# Patient Record
Sex: Female | Born: 1955 | Race: White | Hispanic: No | Marital: Married | Smoking: Former smoker
Health system: Southern US, Community
[De-identification: ages and names within clinical notes are randomized; demographics above are authoritative.]

## PROBLEM LIST (undated history)

## (undated) DIAGNOSIS — I00 Rheumatic fever without heart involvement: Secondary | ICD-10-CM

## (undated) DIAGNOSIS — Z972 Presence of dental prosthetic device (complete) (partial): Secondary | ICD-10-CM

## (undated) DIAGNOSIS — Z87442 Personal history of urinary calculi: Secondary | ICD-10-CM

## (undated) DIAGNOSIS — J189 Pneumonia, unspecified organism: Secondary | ICD-10-CM

## (undated) DIAGNOSIS — G510 Bell's palsy: Secondary | ICD-10-CM

## (undated) HISTORY — PX: APPENDECTOMY: SHX54

## (undated) HISTORY — PX: COLONOSCOPY: SHX174

## (undated) HISTORY — PX: KIDNEY STONE SURGERY: SHX686

---

## 1978-10-25 HISTORY — PX: APPENDECTOMY: SHX54

## 2002-10-25 HISTORY — PX: KIDNEY STONE SURGERY: SHX686

## 2006-10-25 DIAGNOSIS — I82409 Acute embolism and thrombosis of unspecified deep veins of unspecified lower extremity: Secondary | ICD-10-CM

## 2006-10-25 HISTORY — DX: Acute embolism and thrombosis of unspecified deep veins of unspecified lower extremity: I82.409

## 2020-05-30 ENCOUNTER — Other Ambulatory Visit: Payer: Self-pay | Admitting: Internal Medicine

## 2020-05-30 DIAGNOSIS — Z1231 Encounter for screening mammogram for malignant neoplasm of breast: Secondary | ICD-10-CM

## 2020-06-17 ENCOUNTER — Encounter (INDEPENDENT_AMBULATORY_CARE_PROVIDER_SITE_OTHER): Payer: Self-pay

## 2020-06-17 ENCOUNTER — Other Ambulatory Visit: Payer: Self-pay

## 2020-06-17 ENCOUNTER — Ambulatory Visit
Admission: RE | Admit: 2020-06-17 | Discharge: 2020-06-17 | Disposition: A | Discharge status: Home / Self Care | Payer: BC Managed Care – PPO | Source: Ambulatory Visit | Attending: Internal Medicine | Admitting: Internal Medicine

## 2020-06-17 DIAGNOSIS — Z1231 Encounter for screening mammogram for malignant neoplasm of breast: Secondary | ICD-10-CM | POA: Insufficient documentation

## 2020-07-07 ENCOUNTER — Other Ambulatory Visit: Payer: Self-pay | Admitting: Internal Medicine

## 2020-07-07 DIAGNOSIS — R928 Other abnormal and inconclusive findings on diagnostic imaging of breast: Secondary | ICD-10-CM

## 2020-07-14 ENCOUNTER — Ambulatory Visit
Admission: RE | Admit: 2020-07-14 | Discharge: 2020-07-14 | Disposition: A | Discharge status: Home / Self Care | Payer: BC Managed Care – PPO | Source: Ambulatory Visit | Attending: Internal Medicine | Admitting: Internal Medicine

## 2020-07-14 ENCOUNTER — Other Ambulatory Visit: Payer: Self-pay

## 2020-07-14 DIAGNOSIS — R928 Other abnormal and inconclusive findings on diagnostic imaging of breast: Secondary | ICD-10-CM | POA: Insufficient documentation

## 2020-07-21 ENCOUNTER — Other Ambulatory Visit: Payer: Self-pay | Admitting: Internal Medicine

## 2020-07-21 DIAGNOSIS — R928 Other abnormal and inconclusive findings on diagnostic imaging of breast: Secondary | ICD-10-CM

## 2020-07-22 ENCOUNTER — Ambulatory Visit
Admission: RE | Admit: 2020-07-22 | Discharge: 2020-07-22 | Disposition: A | Discharge status: Home / Self Care | Payer: BC Managed Care – PPO | Source: Ambulatory Visit | Attending: Internal Medicine | Admitting: Internal Medicine

## 2020-07-22 ENCOUNTER — Other Ambulatory Visit: Payer: Self-pay

## 2020-07-22 DIAGNOSIS — R928 Other abnormal and inconclusive findings on diagnostic imaging of breast: Secondary | ICD-10-CM

## 2020-07-22 HISTORY — PX: BREAST BIOPSY: SHX20

## 2020-07-23 ENCOUNTER — Telehealth: Payer: Self-pay

## 2020-07-23 LAB — SURGICAL PATHOLOGY

## 2020-07-24 NOTE — Telephone Encounter (Signed)
Patient has an appt with Dr. Carolynne Edouard on 09/07/2020 at 1:30. Patient is aware of time/date.

## 2020-09-10 ENCOUNTER — Ambulatory Visit: Payer: Self-pay | Admitting: General Surgery

## 2020-09-10 DIAGNOSIS — N6021 Fibroadenosis of right breast: Secondary | ICD-10-CM

## 2020-09-17 ENCOUNTER — Other Ambulatory Visit: Payer: Self-pay | Admitting: General Surgery

## 2020-09-17 DIAGNOSIS — N6021 Fibroadenosis of right breast: Secondary | ICD-10-CM

## 2020-10-09 ENCOUNTER — Encounter (HOSPITAL_COMMUNITY): Payer: Self-pay

## 2020-10-09 ENCOUNTER — Other Ambulatory Visit: Payer: Self-pay

## 2020-10-09 ENCOUNTER — Encounter (HOSPITAL_COMMUNITY)
Admission: RE | Admit: 2020-10-09 | Discharge: 2020-10-09 | Disposition: A | Discharge status: Home / Self Care | Payer: BC Managed Care – PPO | Source: Ambulatory Visit | Attending: General Surgery | Admitting: General Surgery

## 2020-10-09 DIAGNOSIS — Z01812 Encounter for preprocedural laboratory examination: Secondary | ICD-10-CM | POA: Insufficient documentation

## 2020-10-09 DIAGNOSIS — N6021 Fibroadenosis of right breast: Secondary | ICD-10-CM | POA: Insufficient documentation

## 2020-10-09 HISTORY — DX: Rheumatic fever without heart involvement: I00

## 2020-10-09 HISTORY — DX: Bell's palsy: G51.0

## 2020-10-09 HISTORY — DX: Personal history of urinary calculi: Z87.442

## 2020-10-09 HISTORY — DX: Pneumonia, unspecified organism: J18.9

## 2020-10-09 LAB — CBC
HCT: 42.9 % (ref 36.0–46.0)
Hemoglobin: 14.5 g/dL (ref 12.0–15.0)
MCH: 32.2 pg (ref 26.0–34.0)
MCHC: 33.8 g/dL (ref 30.0–36.0)
MCV: 95.3 fL (ref 80.0–100.0)
Platelets: 280 10*3/uL (ref 150–400)
RBC: 4.5 MIL/uL (ref 3.87–5.11)
RDW: 11.8 % (ref 11.5–15.5)
WBC: 6.7 10*3/uL (ref 4.0–10.5)
nRBC: 0 % (ref 0.0–0.2)

## 2020-10-09 NOTE — Pre-Procedure Instructions (Signed)
Lori Dyer  10/09/2020    Your procedure is scheduled on Wednesday, October 15, 2020 at 3:30 PM.   Report to [facility] Entrance "A" Admitting Office at 1:30 PM.   Call this number if you have problems the morning of surgery: [phone]   Questions prior to day of surgery, please call [phone] between 8 & 4 PM.   Remember:  Do not eat food after midnight Tuesday, 10/14/20.  You may drink clear liquids until 12:30 PM.  Clear liquids allowed are: Water, Juice (non-citric and without pulp - diabetics please choose diet or no sugar options), Carbonated beverages - (diabetics please choose diet or no sugar options), Clear Tea, Black Coffee only (no creamer, milk or cream including half and half) and Gatorade (diabetics please choose diet or no sugar options)    Take these medicines the morning of surgery with A SIP OF WATER: Atorvastatin (Lipitor), Acetaminophen (Tylenol) - if needed, may use eye drops if needed.  Stop Vitamins as of today prior to surgery. Do not use NSAIDS (Ibuprofen, Aleve, etc), Aspirin containing products, Herbal medications or Fish oil prior to surgery.    Do not wear jewelry, make-up or nail polish.  Do not wear lotions, powders, perfumes or deodorant.  Do not shave 48 hours prior to surgery.    Do not bring valuables to the hospital.  [facility] is not responsible for any belongings or valuables.  Contacts, dentures or bridgework may not be worn into surgery.  Leave your suitcase in the car.  After surgery it may be brought to your room.  For patients admitted to the hospital, discharge time will be determined by your treatment team.  Patients discharged the day of surgery will not be allowed to drive home.   [place] - Preparing for Surgery  Before surgery, you can play an important role.  Because skin is not sterile, your skin needs to be as free of germs as possible.  You can reduce the number of germs on you skin by washing with CHG  (chlorahexidine gluconate) soap before surgery.  CHG is an antiseptic cleaner which kills germs and bonds with the skin to continue killing germs even after washing.  Oral Hygiene is also important in reducing the risk of infection.  Remember to brush your teeth with your regular toothpaste the morning of surgery.  Please DO NOT use if you have an allergy to CHG or antibacterial soaps.  If your skin becomes reddened/irritated stop using the CHG and inform your nurse when you arrive at Short Stay.  Do not shave (including legs and underarms) for at least 48 hours prior to the first CHG shower.  You may shave your face.  Please follow these instructions carefully:   1.  Shower with CHG Soap the night before surgery and the morning of Surgery.  2.  If you choose to wash your hair, wash your hair first as usual with your normal shampoo.  3.  After you shampoo, rinse your hair and body thoroughly to remove the shampoo. 4.  Use CHG as you would any other liquid soap.  You can apply chg directly to the skin and wash gently with a      scrungie or washcloth.           5.  Apply the CHG Soap to your body ONLY FROM THE NECK DOWN.   Do not use on open wounds or open sores. Avoid contact with your eyes, ears, mouth and genitals (  private parts).  Wash genitals (private parts) with your normal soap - do this prior to using CHG soap.  6.  Wash thoroughly, paying special attention to the area where your surgery will be performed.  7.  Thoroughly rinse your body with warm water from the neck down.  8.  DO NOT shower/wash with your normal soap after using and rinsing off the CHG Soap.  9.  Pat yourself dry with a clean towel.            10.  Wear clean pajamas.            11.  Place clean sheets on your bed the night of your first shower and do not sleep with pets.  Day of Surgery  Shower as above. Do not apply any lotions/deodorants the morning of surgery.   Please wear clean clothes to the  hospital. Remember to brush your teeth with toothpaste.  Please read over the fact sheets that you were given.

## 2020-10-09 NOTE — Progress Notes (Signed)
PCP - Dr. Mickey Farber  ERAS Protcol - yes, no drink ordered  COVID TEST- Scheduled for 10/11/20.   Anesthesia review: No  Patient denies shortness of breath, fever, cough and chest pain at PAT appointment   All instructions explained to the patient, with a verbal understanding of the material. Patient agrees to go over the instructions while at home for a better understanding. Patient also instructed to self quarantine after being tested for COVID-19. The opportunity to ask questions was provided.

## 2020-10-10 NOTE — Anesthesia Preprocedure Evaluation (Addendum)
Anesthesia Evaluation  Patient identified by MRN, date of birth, ID band Patient awake    Reviewed: Allergy & Precautions, NPO status , Patient's Chart, lab work & pertinent test results  Airway Mallampati: I  TM Distance: >3 FB Neck ROM: Full    Dental  (+) Dental Advisory Given, Missing   Pulmonary pneumonia, former smoker,    Pulmonary exam normal breath sounds clear to auscultation       Cardiovascular negative cardio ROS Normal cardiovascular exam Rhythm:Regular Rate:Normal     Neuro/Psych  Neuromuscular disease    GI/Hepatic negative GI ROS, Neg liver ROS,   Endo/Other  negative endocrine ROS  Renal/GU negative Renal ROS     Musculoskeletal negative musculoskeletal ROS (+)   Abdominal   Peds  Hematology negative hematology ROS (+)   Anesthesia Other Findings   Reproductive/Obstetrics                          Anesthesia Physical Anesthesia Plan  ASA: II  Anesthesia Plan: General   Post-op Pain Management:    Induction: Intravenous  PONV Risk Score and Plan: 4 or greater and Ondansetron, Dexamethasone and Treatment may vary due to age or medical condition  Airway Management Planned: LMA  Additional Equipment:   Intra-op Plan:   Post-operative Plan: Extubation in OR  Informed Consent: I have reviewed the patients History and Physical, chart, labs and discussed the procedure including the risks, benefits and alternatives for the proposed anesthesia with the patient or authorized representative who has indicated his/her understanding and acceptance.     Dental advisory given  Plan Discussed with: CRNA  Anesthesia Plan Comments: (PAT note written 10/10/2020 by Shonna Chock, PA-C. )      Anesthesia Quick Evaluation

## 2020-10-10 NOTE — Progress Notes (Signed)
Anesthesia Chart Review:  Case: [id number] Date/Time: 10/15/20 1515   Procedure: RIGHT BREAST LUMPECTOMY WITH RADIOACTIVE SEED LOCALIZATION (Right )   Anesthesia type: General   Pre-op diagnosis: right breast sclerosing   [facility]: [facility] ROOM 02 / [facility] OR   Surgeons: Griselda Miner, MD      DISCUSSION: Patient is a 64 year old female scheduled for the above procedure.  History includes former smoker, DVT (RLE, post fall/fracture ~2008), rheumatic fever (age 79), Bell's palsy (right, 2004).  Reported a recent crown with follow-up this week. Prescribed a Z-Pak due to tooth soreness.  She should have completed course prior to [facility] which is scheduled for 10/14/20.  Presurgical COVID-19 test is scheduled for 10/11/2020. Anesthesia team to evaluate on the day of surgery.     VS: BP (!) 147/76   Pulse 65   Temp 36.7 C (Oral)   Resp 17   Ht 5\' 5"  (1.651 m)   Wt 63.9 kg   SpO2 99%   BMI 23.43 kg/m   PROVIDERS: , MD is PCP  Mickey Farber, MD is endocrinologist (for osteoporosis)   LABS: Labs reviewed: Acceptable for surgery. (all labs ordered are listed, but only abnormal results are displayed)  Labs Reviewed  CBC    EKG: N/A   CV: N/A  Past Medical History:  Diagnosis Date  . Bell's palsy   . DVT (deep venous thrombosis) (HCC) 2008   right leg (after a fall and a fracture)  . History of kidney stones    2 times  . Pneumonia    child  . Rheumatic fever in pediatric patient    64 years old    Past Surgical History:  Procedure Laterality Date  . APPENDECTOMY    . BREAST BIOPSY Right 07/22/2020   distortion, path pending  . CESAREAN SECTION    . COLONOSCOPY    . KIDNEY STONE SURGERY      MEDICATIONS: . acetaminophen (TYLENOL) 500 MG tablet  . alendronate (FOSAMAX) 70 MG tablet  . atorvastatin (LIPITOR) 10 MG tablet  . azithromycin (ZITHROMAX) 1 g powder  . Calcium Carb-Cholecalciferol (CALCIUM 600 + D PO)  . cholecalciferol (VITAMIN D3) 25 MCG  (1000 UNIT) tablet  . Naphazoline HCl (CLEAR EYES OP)   No current facility-administered medications for this encounter.    07/24/2020, PA-C Surgical Short Stay/Anesthesiology [facility] Phone [phone] [facility] Phone [phone] 10/10/2020 12:24 PM

## 2020-10-11 ENCOUNTER — Other Ambulatory Visit (HOSPITAL_COMMUNITY)
Admission: RE | Admit: 2020-10-11 | Discharge: 2020-10-11 | Disposition: A | Discharge status: Home / Self Care | Payer: BC Managed Care – PPO | Source: Ambulatory Visit | Attending: General Surgery | Admitting: General Surgery

## 2020-10-11 DIAGNOSIS — Z20822 Contact with and (suspected) exposure to covid-19: Secondary | ICD-10-CM | POA: Insufficient documentation

## 2020-10-11 DIAGNOSIS — Z01812 Encounter for preprocedural laboratory examination: Secondary | ICD-10-CM | POA: Insufficient documentation

## 2020-10-11 LAB — SARS CORONAVIRUS 2 (TAT 6-24 HRS): SARS Coronavirus 2: NEGATIVE

## 2020-10-14 ENCOUNTER — Ambulatory Visit
Admission: RE | Admit: 2020-10-14 | Discharge: 2020-10-14 | Disposition: A | Discharge status: Home / Self Care | Payer: BC Managed Care – PPO | Source: Ambulatory Visit | Attending: General Surgery | Admitting: General Surgery

## 2020-10-14 ENCOUNTER — Other Ambulatory Visit: Payer: Self-pay

## 2020-10-14 DIAGNOSIS — N6021 Fibroadenosis of right breast: Secondary | ICD-10-CM

## 2020-10-15 ENCOUNTER — Other Ambulatory Visit: Payer: Self-pay

## 2020-10-15 ENCOUNTER — Ambulatory Visit (HOSPITAL_COMMUNITY): Payer: BC Managed Care – PPO | Admitting: Anesthesiology

## 2020-10-15 ENCOUNTER — Ambulatory Visit (HOSPITAL_COMMUNITY)
Admission: RE | Admit: 2020-10-15 | Discharge: 2020-10-15 | Disposition: A | Discharge status: Home / Self Care | Payer: BC Managed Care – PPO | Attending: General Surgery | Admitting: General Surgery

## 2020-10-15 ENCOUNTER — Encounter (HOSPITAL_COMMUNITY)
Admission: RE | Disposition: A | Discharge status: Home / Self Care | Payer: Self-pay | Source: Home / Self Care | Attending: General Surgery

## 2020-10-15 ENCOUNTER — Encounter (HOSPITAL_COMMUNITY): Payer: Self-pay | Admitting: General Surgery

## 2020-10-15 ENCOUNTER — Ambulatory Visit (HOSPITAL_COMMUNITY): Payer: BC Managed Care – PPO | Admitting: Vascular Surgery

## 2020-10-15 ENCOUNTER — Ambulatory Visit
Admission: RE | Admit: 2020-10-15 | Discharge: 2020-10-15 | Disposition: A | Discharge status: Home / Self Care | Payer: BC Managed Care – PPO | Source: Ambulatory Visit | Attending: General Surgery | Admitting: General Surgery

## 2020-10-15 DIAGNOSIS — Z79899 Other long term (current) drug therapy: Secondary | ICD-10-CM | POA: Diagnosis not present

## 2020-10-15 DIAGNOSIS — Z803 Family history of malignant neoplasm of breast: Secondary | ICD-10-CM | POA: Insufficient documentation

## 2020-10-15 DIAGNOSIS — Z7983 Long term (current) use of bisphosphonates: Secondary | ICD-10-CM | POA: Diagnosis not present

## 2020-10-15 DIAGNOSIS — Z87891 Personal history of nicotine dependence: Secondary | ICD-10-CM | POA: Insufficient documentation

## 2020-10-15 DIAGNOSIS — N6021 Fibroadenosis of right breast: Secondary | ICD-10-CM | POA: Insufficient documentation

## 2020-10-15 HISTORY — PX: BREAST EXCISIONAL BIOPSY: SUR124

## 2020-10-15 HISTORY — PX: BREAST LUMPECTOMY WITH RADIOACTIVE SEED LOCALIZATION: SHX6424

## 2020-10-15 SURGERY — BREAST LUMPECTOMY WITH RADIOACTIVE SEED LOCALIZATION
Anesthesia: General | Site: Breast | Laterality: Right

## 2020-10-15 MED ORDER — HYDROCODONE-ACETAMINOPHEN 5-325 MG PO TABS: 1.0000 | ORAL_TABLET | Freq: Four times a day (QID) | ORAL | 0 refills | Status: DC | PRN

## 2020-10-15 MED ORDER — 0.9 % SODIUM CHLORIDE (POUR BTL) OPTIME
TOPICAL | Status: DC | PRN
  Administered 2020-10-15: 1000 mL

## 2020-10-15 MED ORDER — CHLORHEXIDINE GLUCONATE 0.12 % MT SOLN
15.0000 mL | Freq: Once | OROMUCOSAL | Status: DC
  Filled 2020-10-15: qty 15

## 2020-10-15 MED ORDER — PROPOFOL 10 MG/ML IV BOLUS
INTRAVENOUS | Status: DC | PRN
  Administered 2020-10-15: 110 mg via INTRAVENOUS
  Administered 2020-10-15: 60 mg via INTRAVENOUS
  Administered 2020-10-15: 30 mg via INTRAVENOUS

## 2020-10-15 MED ORDER — CEFAZOLIN SODIUM-DEXTROSE 2-4 GM/100ML-% IV SOLN
2.0000 g | INTRAVENOUS | Status: AC
  Administered 2020-10-15: 2 g via INTRAVENOUS
  Filled 2020-10-15: qty 100

## 2020-10-15 MED ORDER — LACTATED RINGERS IV SOLN: INTRAVENOUS | Status: DC

## 2020-10-15 MED ORDER — BUPIVACAINE HCL (PF) 0.25 % IJ SOLN
INTRAMUSCULAR | Status: DC | PRN
Start: 2020-10-15 — End: 2020-10-15
  Administered 2020-10-15: 20 mL

## 2020-10-15 MED ORDER — EPHEDRINE SULFATE-NACL 50-0.9 MG/10ML-% IV SOSY
PREFILLED_SYRINGE | INTRAVENOUS | Status: DC | PRN
  Administered 2020-10-15: 5 mg via INTRAVENOUS
  Administered 2020-10-15: 10 mg via INTRAVENOUS
  Administered 2020-10-15: 5 mg via INTRAVENOUS

## 2020-10-15 MED ORDER — GABAPENTIN 300 MG PO CAPS
300.0000 mg | ORAL_CAPSULE | ORAL | Status: AC
  Administered 2020-10-15: 300 mg via ORAL
  Filled 2020-10-15: qty 1

## 2020-10-15 MED ORDER — FENTANYL CITRATE (PF) 250 MCG/5ML IJ SOLN
INTRAMUSCULAR | Status: AC
  Filled 2020-10-15: qty 5

## 2020-10-15 MED ORDER — CHLORHEXIDINE GLUCONATE CLOTH 2 % EX PADS: 6.0000 | MEDICATED_PAD | Freq: Once | CUTANEOUS | Status: DC

## 2020-10-15 MED ORDER — PROPOFOL 10 MG/ML IV BOLUS
INTRAVENOUS | Status: AC
  Filled 2020-10-15: qty 20

## 2020-10-15 MED ORDER — CELECOXIB 200 MG PO CAPS
200.0000 mg | ORAL_CAPSULE | ORAL | Status: AC
  Administered 2020-10-15: 200 mg via ORAL
  Filled 2020-10-15: qty 1

## 2020-10-15 MED ORDER — LIDOCAINE 2% (20 MG/ML) 5 ML SYRINGE
INTRAMUSCULAR | Status: DC | PRN
  Administered 2020-10-15: 50 mg via INTRAVENOUS

## 2020-10-15 MED ORDER — DEXAMETHASONE SODIUM PHOSPHATE 10 MG/ML IJ SOLN
INTRAMUSCULAR | Status: AC
  Filled 2020-10-15: qty 1

## 2020-10-15 MED ORDER — LIDOCAINE 2% (20 MG/ML) 5 ML SYRINGE
INTRAMUSCULAR | Status: AC
  Filled 2020-10-15: qty 5

## 2020-10-15 MED ORDER — ORAL CARE MOUTH RINSE: 15.0000 mL | Freq: Once | OROMUCOSAL | Status: DC

## 2020-10-15 MED ORDER — ONDANSETRON HCL 4 MG/2ML IJ SOLN
INTRAMUSCULAR | Status: DC | PRN
  Administered 2020-10-15: 4 mg via INTRAVENOUS

## 2020-10-15 MED ORDER — FENTANYL CITRATE (PF) 100 MCG/2ML IJ SOLN
INTRAMUSCULAR | Status: DC | PRN
  Administered 2020-10-15: 25 ug via INTRAVENOUS

## 2020-10-15 MED ORDER — DEXAMETHASONE SODIUM PHOSPHATE 10 MG/ML IJ SOLN
INTRAMUSCULAR | Status: DC | PRN
  Administered 2020-10-15: 4 mg via INTRAVENOUS

## 2020-10-15 MED ORDER — ONDANSETRON HCL 4 MG/2ML IJ SOLN
INTRAMUSCULAR | Status: AC
  Filled 2020-10-15: qty 2

## 2020-10-15 MED ORDER — ACETAMINOPHEN 500 MG PO TABS
1000.0000 mg | ORAL_TABLET | ORAL | Status: AC
  Administered 2020-10-15: 1000 mg via ORAL
  Filled 2020-10-15: qty 2

## 2020-10-15 MED ORDER — BUPIVACAINE HCL (PF) 0.25 % IJ SOLN
INTRAMUSCULAR | Status: AC
  Filled 2020-10-15: qty 30

## 2020-10-15 SURGICAL SUPPLY — 31 items
APPLIER CLIP 9.375 MED OPEN (MISCELLANEOUS)
BINDER BREAST LRG (GAUZE/BANDAGES/DRESSINGS) IMPLANT
BINDER BREAST XLRG (GAUZE/BANDAGES/DRESSINGS) IMPLANT
CANISTER SUCT 3000ML PPV (MISCELLANEOUS) ×2 IMPLANT
CHLORAPREP W/TINT 26 (MISCELLANEOUS) ×2 IMPLANT
CLIP APPLIE 9.375 MED OPEN (MISCELLANEOUS) IMPLANT
COVER PROBE W GEL 5X96 (DRAPES) ×2 IMPLANT
COVER SURGICAL LIGHT HANDLE (MISCELLANEOUS) ×2 IMPLANT
COVER WAND RF STERILE (DRAPES) IMPLANT
DERMABOND ADVANCED (GAUZE/BANDAGES/DRESSINGS) ×1
DERMABOND ADVANCED .7 DNX12 (GAUZE/BANDAGES/DRESSINGS) ×1 IMPLANT
DEVICE DUBIN SPECIMEN MAMMOGRA (MISCELLANEOUS) ×2 IMPLANT
DRAPE CHEST BREAST 15X10 FENES (DRAPES) ×2 IMPLANT
ELECT COATED BLADE 2.86 ST (ELECTRODE) ×2 IMPLANT
ELECT REM PT RETURN 9FT ADLT (ELECTROSURGICAL) ×2
ELECTRODE REM PT RTRN 9FT ADLT (ELECTROSURGICAL) ×1 IMPLANT
GLOVE BIO SURGEON STRL SZ7.5 (GLOVE) ×4 IMPLANT
GOWN STRL REUS W/ TWL LRG LVL3 (GOWN DISPOSABLE) ×2 IMPLANT
GOWN STRL REUS W/TWL LRG LVL3 (GOWN DISPOSABLE) ×2
KIT BASIN OR (CUSTOM PROCEDURE TRAY) ×2 IMPLANT
KIT MARKER MARGIN INK (KITS) ×2 IMPLANT
LIGHT WAVEGUIDE WIDE FLAT (MISCELLANEOUS) IMPLANT
NEEDLE HYPO 25GX1X1/2 BEV (NEEDLE) ×2 IMPLANT
NS IRRIG 1000ML POUR BTL (IV SOLUTION) ×2 IMPLANT
PACK GENERAL/GYN (CUSTOM PROCEDURE TRAY) ×2 IMPLANT
SUT MNCRL AB 4-0 PS2 18 (SUTURE) ×2 IMPLANT
SUT SILK 2 0 SH (SUTURE) IMPLANT
SUT VIC AB 3-0 SH 18 (SUTURE) ×2 IMPLANT
SYR CONTROL 10ML LL (SYRINGE) ×2 IMPLANT
TOWEL GREEN STERILE (TOWEL DISPOSABLE) ×2 IMPLANT
TOWEL GREEN STERILE FF (TOWEL DISPOSABLE) ×2 IMPLANT

## 2020-10-15 NOTE — Anesthesia Procedure Notes (Signed)
Procedure Name: LMA Insertion °Performed by: Zoe Nordin H, CRNA °Pre-anesthesia Checklist: Patient identified, Emergency Drugs available, Suction available and Patient being monitored °Patient Re-evaluated:Patient Re-evaluated prior to induction °Oxygen Delivery Method: Circle System Utilized °Preoxygenation: Pre-oxygenation with 100% oxygen °Induction Type: IV induction °Ventilation: Mask ventilation without difficulty °LMA: LMA inserted °LMA Size: 4.0 °Number of attempts: 1 °Airway Equipment and Method: Bite block °Placement Confirmation: positive ETCO2 °Tube secured with: Tape °Dental Injury: Teeth and Oropharynx as per pre-operative assessment  ° ° ° ° ° ° °

## 2020-10-15 NOTE — Anesthesia Postprocedure Evaluation (Signed)
Anesthesia Post Note  Patient: Lori Dyer  Procedure(s) Performed: RIGHT BREAST LUMPECTOMY WITH RADIOACTIVE SEED LOCALIZATION (Right Breast)     Patient location during evaluation: PACU Anesthesia Type: General Level of consciousness: sedated and patient cooperative Pain management: pain level controlled Vital Signs Assessment: post-procedure vital signs reviewed and stable Respiratory status: spontaneous breathing Cardiovascular status: stable Anesthetic complications: no   No complications documented.  Last Vitals:  Vitals:   10/15/20 1652 10/15/20 1658  BP: (!) 144/66 (!) 151/74  Pulse: (!) 59 62  Resp: 12 13  Temp:  36.6 C  SpO2: 99% 98%    Last Pain:  Vitals:   10/15/20 1658  TempSrc:   PainSc: 0-No pain                 Lewie Loron

## 2020-10-15 NOTE — Op Note (Signed)
10/15/2020  4:14 PM  PATIENT:  Lori Dyer  64 y.o. female  PRE-OPERATIVE DIAGNOSIS:  right breast sclerosing  POST-OPERATIVE DIAGNOSIS:  right breast sclerosing  PROCEDURE:  Procedure(s): RIGHT BREAST LUMPECTOMY WITH RADIOACTIVE SEED LOCALIZATION (Right)  SURGEON:  Surgeon(s) and Role:    Griselda Miner, MD - Primary  PHYSICIAN ASSISTANT:   ASSISTANTS: none   ANESTHESIA:   local and general  EBL:  Minimal   BLOOD ADMINISTERED:none  DRAINS: none   LOCAL MEDICATIONS USED:  MARCAINE     SPECIMEN:  Source of Specimen:  right breast tissue  DISPOSITION OF SPECIMEN:  PATHOLOGY  COUNTS:  YES  TOURNIQUET:  * No tourniquets in log *  DICTATION: .Dragon Dictation   After informed consent was obtained the patient was brought to the operating room and placed in the supine position on the operating table.  After adequate induction of general anesthesia the patient's right breast was prepped with ChloraPrep, allowed to dry, and draped in usual sterile manner.  An appropriate timeout was performed.  Previously an I-125 seed was placed in the lateral aspect of the right breast to mark an area of complex sclerosing lesion.  The neoprobe was set to I-125 in the area of radioactivity was readily identified.  The area around this was infiltrated with quarter percent Marcaine.  A curvilinear incision was then made L along the outer edge of the areola of the right breast with a 15 blade knife.  The incision was carried through the skin and subcutaneous tissue sharply with the electrocautery.  Dissection was then carried towards the radioactive seed under the direction of the neoprobe.  Once I more closely approached the radioactive seed I then removed a circular portion of breast tissue sharply with the electrocautery around the radioactive seed while checking the area of radioactivity frequently.  Once the specimen was removed it was oriented with the appropriate paint colors.  A specimen  radiograph was obtained that showed the clip and seed to be near the center of the specimen.  The specimen was then sent to pathology for further evaluation.  Hemostasis was achieved using the Bovie electrocautery.  The wound was irrigated with saline and infiltrated with more quarter percent Marcaine.  The deep layer of the wound was then closed with layers of interrupted 3-0 Vicryl stitches.  The skin was closed with interrupted 4-0 Monocryl subcuticular stitches.  Dermabond dressings were applied.  The patient tolerated the procedure well.  At the end of the case all needle sponge and instrument counts were correct.  The patient was then awakened and taken recovery in stable condition.  PLAN OF CARE: Discharge to home after PACU  PATIENT DISPOSITION:  PACU - hemodynamically stable.   Delay start of Pharmacological VTE agent (>24hrs) due to surgical blood loss or risk of bleeding: not applicable

## 2020-10-15 NOTE — Interval H&P Note (Signed)
History and Physical Interval Note:  10/15/2020 2:58 PM  Lori Dyer  has presented today for surgery, with the diagnosis of right breast sclerosing.  The various methods of treatment have been discussed with the patient and family. After consideration of risks, benefits and other options for treatment, the patient has consented to  Procedure(s): RIGHT BREAST LUMPECTOMY WITH RADIOACTIVE SEED LOCALIZATION (Right) as a surgical intervention.  The patient's history has been reviewed, patient examined, no change in status, stable for surgery.  I have reviewed the patient's chart and labs.  Questions were answered to the patient's satisfaction.     Chevis Pretty III

## 2020-10-15 NOTE — OR Nursing (Signed)
Pt is awake,alert and oriented. Pt is in NAD at this time. Pt and family verbalized understanding of poc and discharge instructions. instructions given to family and reviewed prior to discharge.Pt is ambulatory to wheelchair with stand by assist but not needed with steady gait.  Pt taken to front lobby and placed in car with family.   

## 2020-10-15 NOTE — H&P (Signed)
Lori Dyer  Location: [facility] Patient #: [id number] DOB: [birth year] Married / Language: English / Race: White Female   History of Present Illness  The patient is a 64 year old female who presents with a breast mass. We are asked to see the patient in consultation by Dr. Mickey Farber to evaluate her for a distortion in the right breast. The patient is a 64 year old white female who recently went for a routine screening mammogram. At that time she was found to have a fairly well-defined distortion in the upper outer quadrant of the right breast that measured at least a centimeter. 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 She denies any breast pain or discharge from the nipple. She does have a family history of breast cancer in a sister. She is otherwise in good health. She quit smoking in 2008   Past Surgical History  Appendectomy  Breast Biopsy  Right. Cesarean Section - 1  Foot Surgery  Bilateral.  Diagnostic Studies History  Colonoscopy  5-10 years ago Mammogram  within last year Pap Smear  1-5 years ago  Allergies  No Known Drug Allergies    Medication History  Alendronate Sodium (70MG  Tablet, Oral) Active. Atorvastatin Calcium (10MG  Tablet, Oral) Active. Medications Reconciled  Social History  Alcohol use  Moderate alcohol use. Caffeine use  Coffee. Tobacco use  Former smoker.  Family History  Breast Cancer  Sister. Cancer  Father, Mother. Cerebrovascular Accident  Father. Depression  Daughter. Diabetes Mellitus  Brother, Sister. Hypertension  Brother, Father. Ischemic Bowel Disease  Sister. Respiratory Condition  Father. Seizure disorder  Sister.  Pregnancy / Birth History  Age at menarche  14 years. Age of menopause  36-50 Contraceptive History  Intrauterine device, Oral contraceptives. Gravida  1 Maternal age  61-35 Para  1 Regular periods    Other Problems  Hypercholesterolemia  Kidney Stone  Pulmonary Embolism / Blood Clot in Legs     Review of Systems  General Not Present- Appetite Loss, Chills, Fatigue, Fever, Night Sweats, Weight Gain and Weight Loss. Skin Not Present- Change in Wart/Mole, Dryness, Hives, Jaundice, New Lesions, Non-Healing Wounds, Rash and Ulcer. HEENT Present- Hearing Loss. Not Present- Earache, Hoarseness, Nose Bleed, Oral Ulcers, Ringing in the Ears, Seasonal Allergies, Sinus Pain, Sore Throat, Visual Disturbances, Wears glasses/contact lenses and Yellow Eyes. Respiratory Not Present- Bloody sputum, Chronic Cough, Difficulty Breathing, Snoring and Wheezing. Breast Not Present- Breast Mass, Breast Pain, Nipple Discharge and Skin Changes. Cardiovascular Not Present- Chest Pain, Difficulty Breathing Lying Down, Leg Cramps, Palpitations, Rapid Heart Rate, Shortness of Breath and Swelling of Extremities. Gastrointestinal Not Present- Abdominal Pain, Bloating, Bloody Stool, Change in Bowel Habits, Chronic diarrhea, Constipation, Difficulty Swallowing, Excessive gas, Gets full quickly at meals, Hemorrhoids, Indigestion, Nausea, Rectal Pain and Vomiting. Female Genitourinary Not Present- Frequency, Nocturia, Painful Urination, Pelvic Pain and Urgency. Musculoskeletal Not Present- Back Pain, Joint Pain, Joint Stiffness, Muscle Pain, Muscle Weakness and Swelling of Extremities. Neurological Not Present- Decreased Memory, Fainting, Headaches, Numbness, Seizures, Tingling, Tremor, Trouble walking and Weakness. Psychiatric Not Present- Anxiety, Bipolar, Change in Sleep Pattern, Depression, Fearful and Frequent crying. Endocrine Not Present- Cold Intolerance, Excessive Hunger, Hair Changes, Heat Intolerance, Hot flashes and New Diabetes. Hematology Not Present- Blood Thinners, Easy Bruising, Excessive bleeding, Gland problems, HIV and Persistent Infections.  Vitals  Weight: 141.13 lb Height: 65in Body  Surface Area: 1.71 m Body Mass Index: 23.48 kg/m  Temp.: 51F  Pulse: 70 (Regular)  BP: 130/80(Sitting, Left  Arm, Standard)       Physical Exam  General Mental Status-Alert. General Appearance-Consistent with stated age. Hydration-Well hydrated. Voice-Normal.  Head and Neck Head-normocephalic, atraumatic with no lesions or palpable masses. Trachea-midline. Thyroid Gland Characteristics - normal size and consistency.  Eye Eyeball - Bilateral-Extraocular movements intact. Sclera/Conjunctiva - Bilateral-No scleral icterus.  Chest and Lung Exam Chest and lung exam reveals -quiet, even and easy respiratory effort with no use of accessory muscles and on auscultation, normal breath sounds, no adventitious sounds and normal vocal resonance. Inspection Chest Wall - Normal. Back - normal.  Breast Note: There is no palpable mass in either breast. There is no palpable axillary, supraclavicular, or cervical lymphadenopathy.   Cardiovascular Cardiovascular examination reveals -normal heart sounds, regular rate and rhythm with no murmurs and normal pedal pulses bilaterally.  Abdomen Inspection Inspection of the abdomen reveals - No Hernias. Skin - Scar - no surgical scars. Palpation/Percussion Palpation and Percussion of the abdomen reveal - Soft, Non Tender, No Rebound tenderness, No Rigidity (guarding) and No hepatosplenomegaly. Auscultation Auscultation of the abdomen reveals - Bowel sounds normal.  Neurologic Neurologic evaluation reveals -alert and oriented x 3 with no impairment of recent or remote memory. Mental Status-Normal.  Musculoskeletal Normal Exam - Left-Upper Extremity Strength Normal and Lower Extremity Strength Normal. Normal Exam - Right-Upper Extremity Strength Normal and Lower Extremity Strength Normal.  Lymphatic Head & Neck  General Head & Neck Lymphatics: Bilateral - Description - Normal. Axillary  General  Axillary Region: Bilateral - Description - Normal. Tenderness - Non Tender. Femoral & Inguinal  Generalized Femoral & Inguinal Lymphatics: Bilateral - Description - Normal. Tenderness - Non Tender.    Assessment & Plan SCLEROSING ADENOSIS OF BREAST, RIGHT (N60.21) Impression: The patient appears to have a 1 cm area of sclerosing adenosis in the upper outer quadrant of the right breast. Because of its abnormal appearance and because there is a 5-10% chance of missing something more significant and I think it would be reasonable to remove this area. She would also like to have this done. I have discussed with her in detail the risks and benefits of the operation as well as some of the technical aspects including the use of a radioactive seed for localization and she understands and wishes to proceed. This patient encounter took 30 minutes today to perform the following: take history, perform exam, review outside records, interpret imaging, counsel the patient on their diagnosis and document encounter, findings & plan in the EHR

## 2020-10-15 NOTE — Transfer of Care (Signed)
Immediate Anesthesia Transfer of Care Note  Patient: Lori Dyer  Procedure(s) Performed: RIGHT BREAST LUMPECTOMY WITH RADIOACTIVE SEED LOCALIZATION (Right Breast)  Patient Location: PACU  Anesthesia Type:General  Level of Consciousness: awake  Airway & Oxygen Therapy: Patient Spontanous Breathing  Post-op Assessment: Report given to RN and Post -op Vital signs reviewed and stable  Post vital signs: Reviewed and stable  Last Vitals:  Vitals Value Taken Time  BP 146/67 10/15/20 1620  Temp    Pulse 73 10/15/20 1621  Resp 13 10/15/20 1621  SpO2 98 % 10/15/20 1621  Vitals shown include unvalidated device data.  Last Pain:  Vitals:   10/15/20 1359  TempSrc:   PainSc: 0-No pain         Complications: No complications documented.

## 2020-10-16 ENCOUNTER — Encounter (HOSPITAL_COMMUNITY): Payer: Self-pay | Admitting: General Surgery

## 2020-10-20 LAB — SURGICAL PATHOLOGY

## 2021-06-14 LAB — COLOGUARD: COLOGUARD: NEGATIVE

## 2021-09-27 IMAGING — MG MM DIGITAL DIAGNOSTIC UNILAT*R* W/ TOMO W/ CAD
8 series · 9 of 24 positions shown · non-contrast
Comparison: Previous exam(s).

CLINICAL DATA: Patient was recalled from screening mammogram for
possible distortion in the right breast.

EXAM:
DIGITAL DIAGNOSTIC RIGHT MAMMOGRAM WITH CAD AND TOMO
ULTRASOUND RIGHT BREAST

[R ML synth-2D]
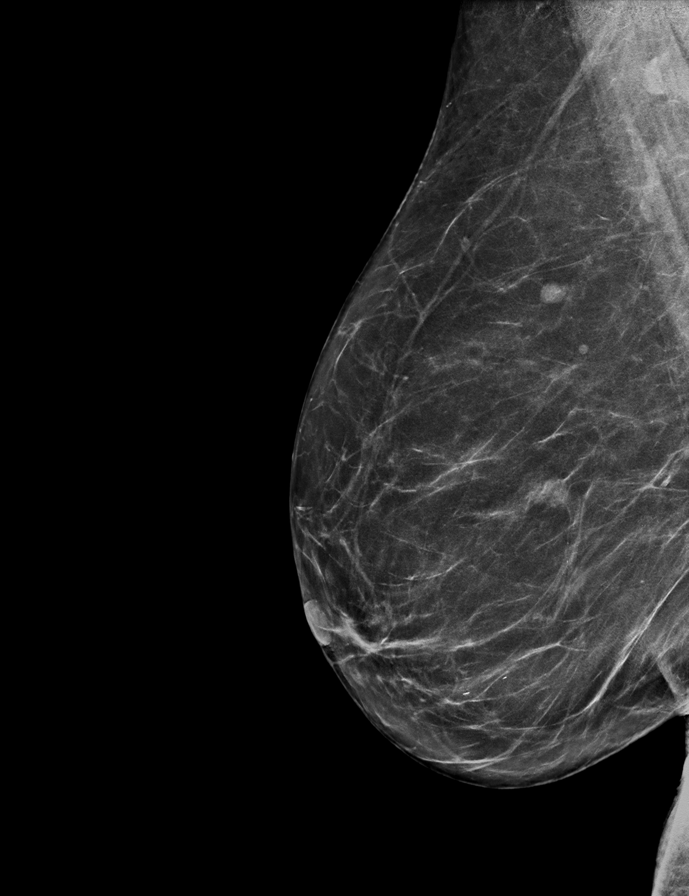

[R CC synth-2D (1 of 2)]
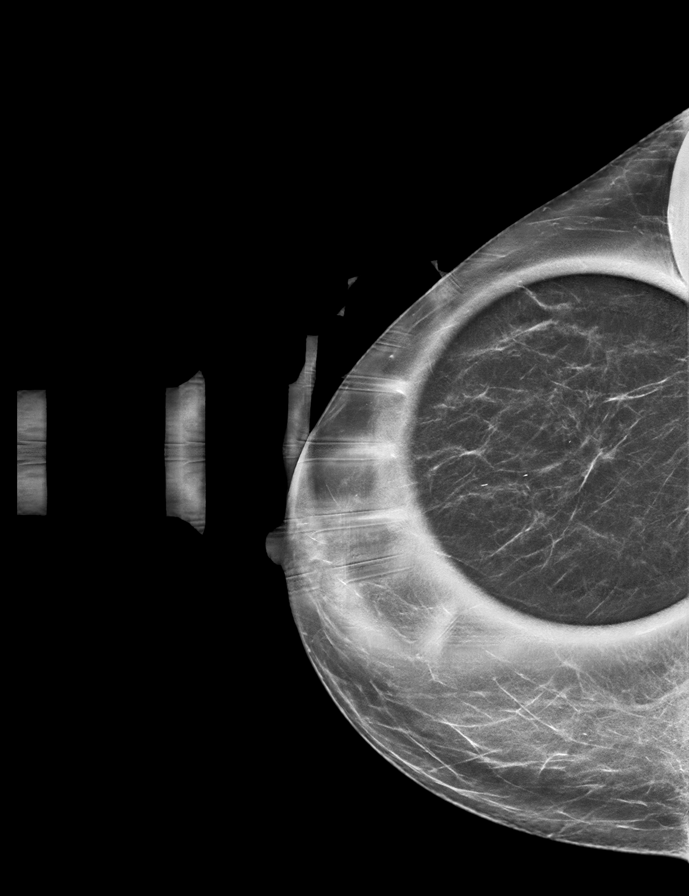

[R MLO synth-2D]
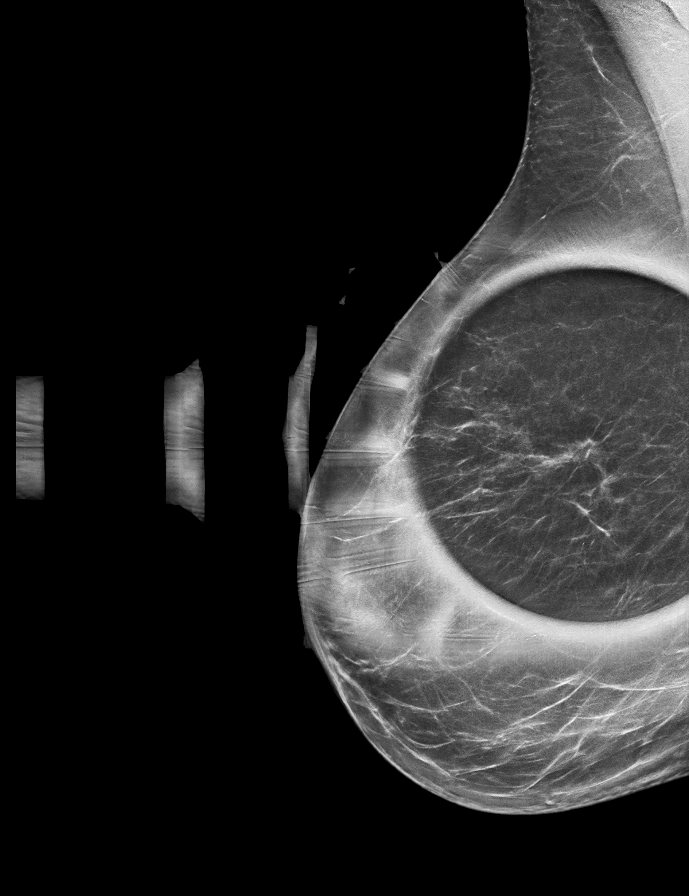

[R CC synth-2D (2 of 2)]
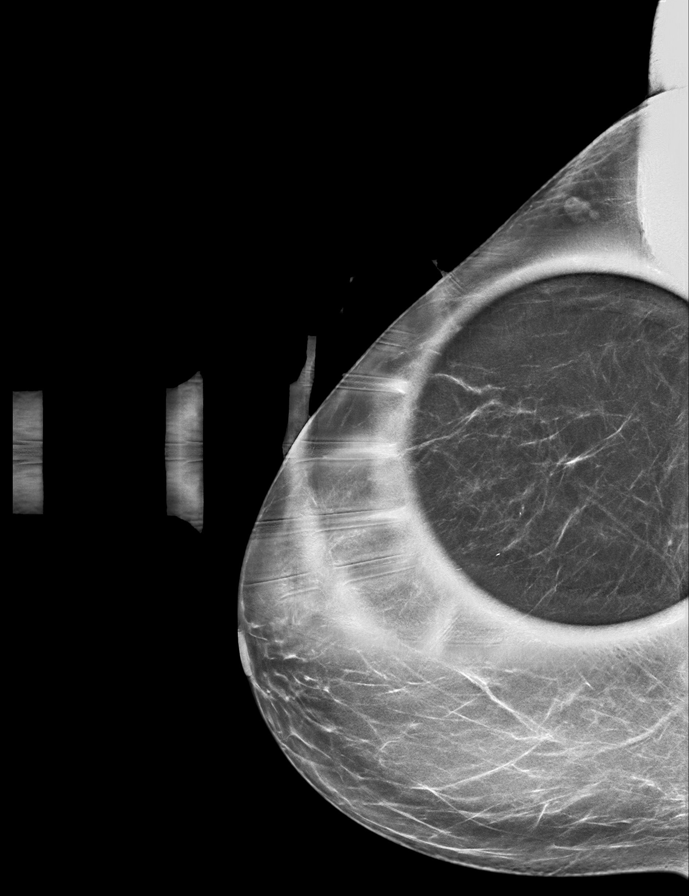

[R CC tomo · 2 of 62 frames shown (1 of 2)]
[frame 21/62]
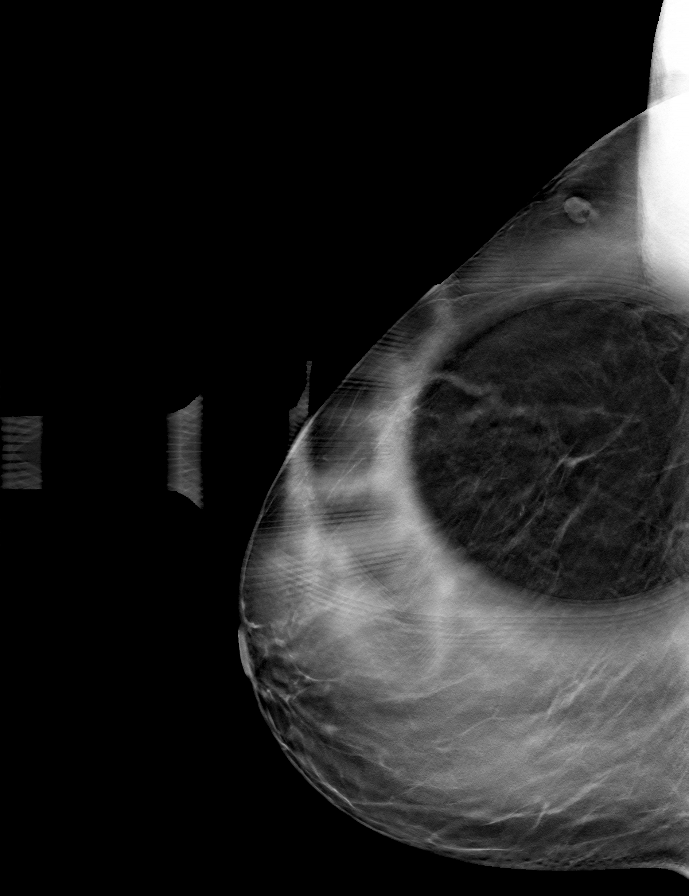
[frame 31/62]
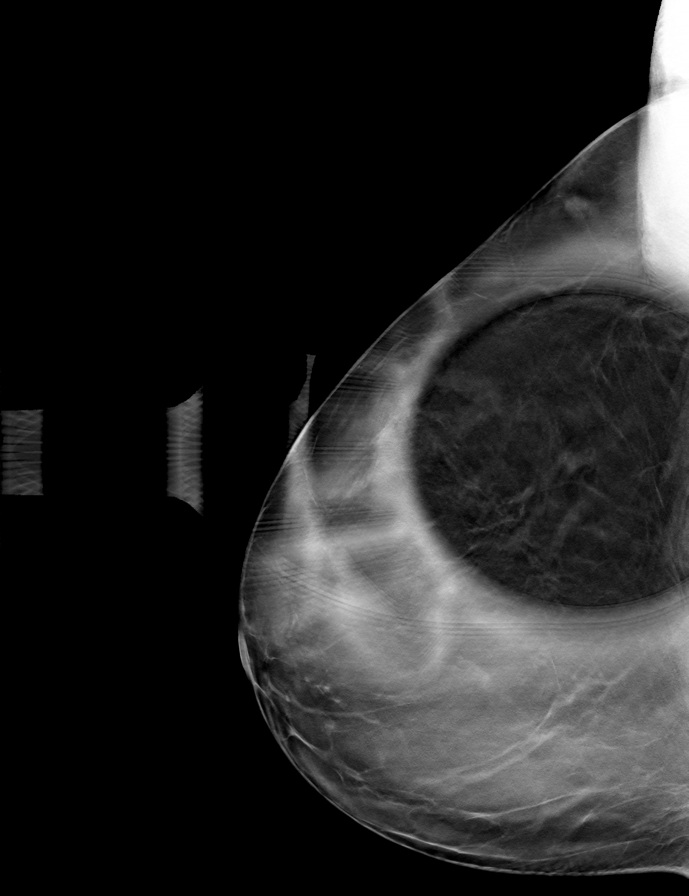

[R ML tomo · tomo slice 35/68.0]
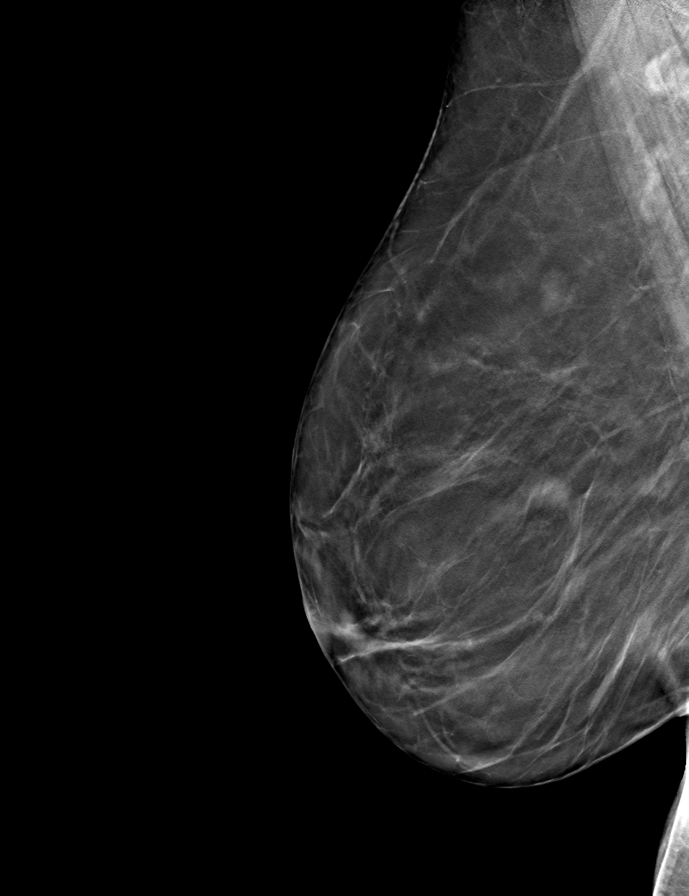

[R MLO tomo · tomo slice 32/63.0]
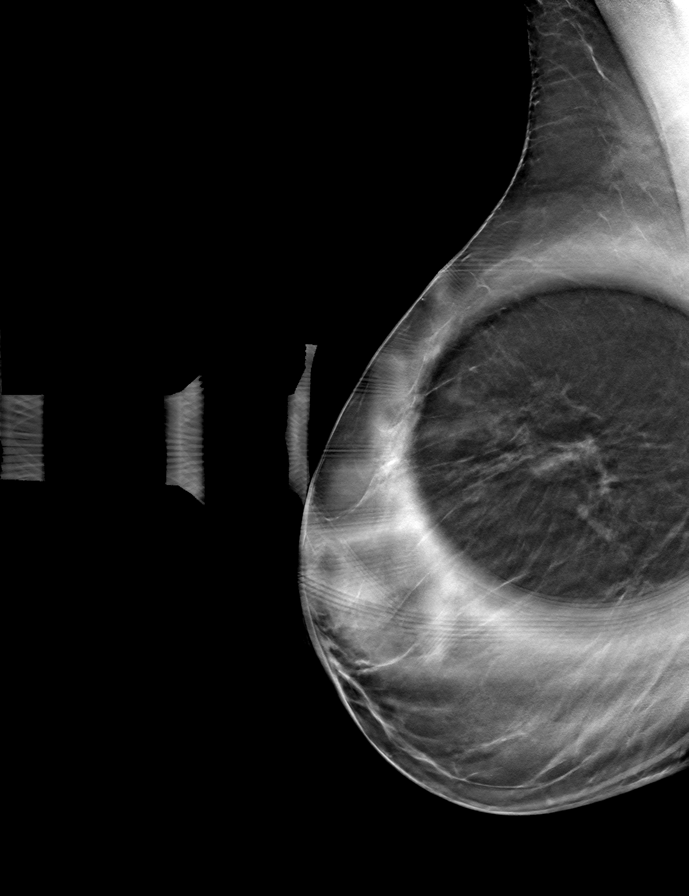

[R CC tomo (2 of 2) · tomo slice 31/60.0]
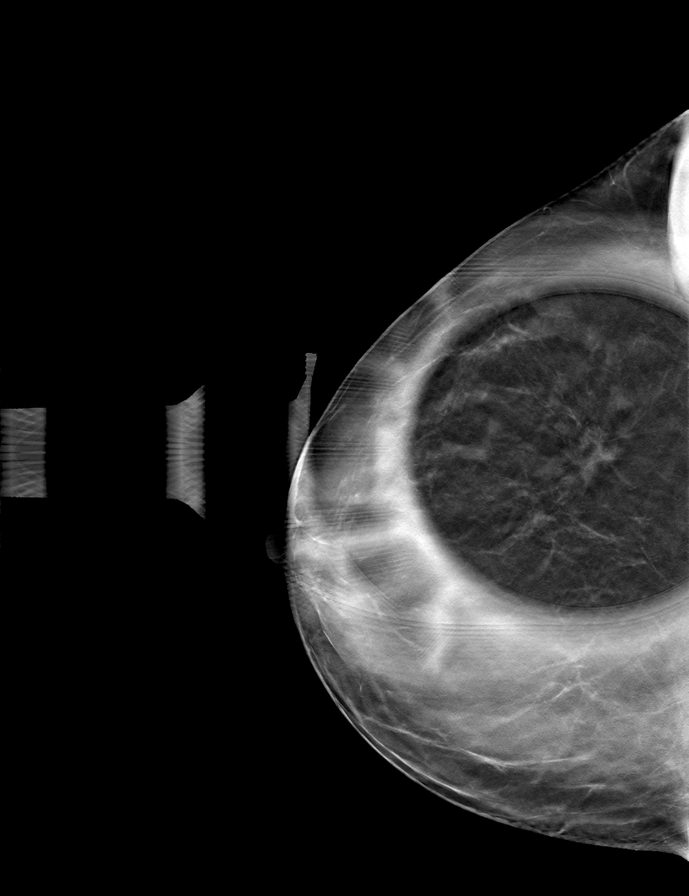

[9 of 24 positions shown; findings below may reference images not displayed]

ACR Breast Density Category b: There are scattered areas of
fibroglandular density.
FINDINGS: Additional imaging of the right breast was performed. There is
persistent subtle distortion in the upper-outer quadrant of the
breast. There is no mass or malignant type microcalcifications.

Mammographic images were processed with CAD.

Targeted ultrasound is performed, showing normal tissue in the
upper-outer quadrant of the right breast. Sonographic evaluation of
the right axilla does not show any enlarged adenopathy.
IMPRESSION: Persistent distortion in the upper-outer quadrant of the right
breast.

RECOMMENDATION:
Stereotactic biopsy of the distortion in the upper-outer quadrant of
the right breast is recommended.

I have discussed the findings and recommendations with the patient.
If applicable, a reminder letter will be sent to the patient
regarding the next appointment.

BI-RADS CATEGORY  4: Suspicious.

## 2021-12-08 ENCOUNTER — Encounter (HOSPITAL_COMMUNITY): Payer: Self-pay

## 2022-06-07 ENCOUNTER — Other Ambulatory Visit: Payer: Self-pay | Admitting: Infectious Diseases

## 2022-06-07 DIAGNOSIS — Z1231 Encounter for screening mammogram for malignant neoplasm of breast: Secondary | ICD-10-CM

## 2022-06-10 ENCOUNTER — Ambulatory Visit
Admission: RE | Admit: 2022-06-10 | Discharge: 2022-06-10 | Disposition: A | Discharge status: Home / Self Care | Payer: BC Managed Care – PPO | Source: Ambulatory Visit | Attending: Infectious Diseases | Admitting: Infectious Diseases

## 2022-06-10 DIAGNOSIS — Z1231 Encounter for screening mammogram for malignant neoplasm of breast: Secondary | ICD-10-CM | POA: Insufficient documentation

## 2023-06-07 ENCOUNTER — Other Ambulatory Visit: Payer: Self-pay | Admitting: Infectious Diseases

## 2023-06-07 DIAGNOSIS — Z1231 Encounter for screening mammogram for malignant neoplasm of breast: Secondary | ICD-10-CM

## 2023-06-21 ENCOUNTER — Ambulatory Visit
Admission: RE | Admit: 2023-06-21 | Discharge: 2023-06-21 | Disposition: A | Discharge status: Home / Self Care | Payer: BC Managed Care – PPO | Source: Ambulatory Visit | Attending: Infectious Diseases | Admitting: Infectious Diseases

## 2023-06-21 DIAGNOSIS — Z1231 Encounter for screening mammogram for malignant neoplasm of breast: Secondary | ICD-10-CM | POA: Diagnosis present

## 2023-11-10 ENCOUNTER — Encounter: Payer: Self-pay | Admitting: Orthopedic Surgery

## 2023-11-10 ENCOUNTER — Other Ambulatory Visit: Payer: Self-pay | Admitting: Orthopedic Surgery

## 2023-11-11 ENCOUNTER — Other Ambulatory Visit: Payer: Self-pay | Admitting: Orthopedic Surgery

## 2023-11-14 NOTE — Discharge Instructions (Signed)
Keep arm elevated is much as possible Work on moving fingers but not the thumb Ice to the back of the hand today and tomorrow to help with pain and swelling Pain medicine as directed Call office if you are having problems [phone]

## 2023-11-15 ENCOUNTER — Ambulatory Visit: Payer: Self-pay | Admitting: Anesthesiology

## 2023-11-15 ENCOUNTER — Ambulatory Visit
Admission: RE | Admit: 2023-11-15 | Discharge: 2023-11-15 | Disposition: A | Discharge status: Home / Self Care | Payer: BC Managed Care – PPO | Attending: Orthopedic Surgery | Admitting: Orthopedic Surgery

## 2023-11-15 ENCOUNTER — Encounter: Payer: Self-pay | Admitting: Orthopedic Surgery

## 2023-11-15 ENCOUNTER — Ambulatory Visit: Payer: Self-pay

## 2023-11-15 ENCOUNTER — Encounter
Admission: RE | Disposition: A | Discharge status: Home / Self Care | Payer: Self-pay | Source: Home / Self Care | Attending: Orthopedic Surgery

## 2023-11-15 ENCOUNTER — Other Ambulatory Visit: Payer: Self-pay

## 2023-11-15 DIAGNOSIS — Z87891 Personal history of nicotine dependence: Secondary | ICD-10-CM | POA: Insufficient documentation

## 2023-11-15 DIAGNOSIS — X58XXXA Exposure to other specified factors, initial encounter: Secondary | ICD-10-CM | POA: Insufficient documentation

## 2023-11-15 DIAGNOSIS — S52531A Colles' fracture of right radius, initial encounter for closed fracture: Secondary | ICD-10-CM | POA: Diagnosis present

## 2023-11-15 HISTORY — DX: Presence of dental prosthetic device (complete) (partial): Z97.2

## 2023-11-15 HISTORY — PX: OPEN REDUCTION INTERNAL FIXATION (ORIF) DISTAL RADIAL FRACTURE: SHX5989

## 2023-11-15 SURGERY — OPEN REDUCTION INTERNAL FIXATION (ORIF) DISTAL RADIUS FRACTURE
Anesthesia: General | Laterality: Right

## 2023-11-15 MED ORDER — DROPERIDOL 2.5 MG/ML IJ SOLN: 0.6250 mg | Freq: Once | INTRAMUSCULAR | Status: DC | PRN

## 2023-11-15 MED ORDER — HYDROCODONE-ACETAMINOPHEN 7.5-325 MG PO TABS
1.0000 | ORAL_TABLET | ORAL | Status: DC | PRN
Start: 2023-11-15 — End: 2023-11-15

## 2023-11-15 MED ORDER — KETOROLAC TROMETHAMINE 30 MG/ML IJ SOLN
INTRAMUSCULAR | Status: AC
  Filled 2023-11-15: qty 1

## 2023-11-15 MED ORDER — HYDROCODONE-ACETAMINOPHEN 5-325 MG PO TABS: 1.0000 | ORAL_TABLET | ORAL | 0 refills | Status: AC | PRN

## 2023-11-15 MED ORDER — CEFAZOLIN SODIUM-DEXTROSE 2-3 GM-%(50ML) IV SOLR
INTRAVENOUS | Status: AC
  Filled 2023-11-15: qty 50

## 2023-11-15 MED ORDER — BUPIVACAINE HCL (PF) 0.5 % IJ SOLN
INTRAMUSCULAR | Status: DC | PRN
  Administered 2023-11-15: 10 mL

## 2023-11-15 MED ORDER — DEXAMETHASONE SODIUM PHOSPHATE 4 MG/ML IJ SOLN
INTRAMUSCULAR | Status: DC | PRN
  Administered 2023-11-15: 4 mg via INTRAVENOUS

## 2023-11-15 MED ORDER — ACETAMINOPHEN 325 MG PO TABS: 325.0000 mg | ORAL_TABLET | Freq: Four times a day (QID) | ORAL | Status: DC | PRN

## 2023-11-15 MED ORDER — HYDRALAZINE HCL 20 MG/ML IJ SOLN
10.0000 mg | Freq: Four times a day (QID) | INTRAMUSCULAR | Status: DC | PRN
  Administered 2023-11-15: 10 mg via INTRAVENOUS
  Administered 2023-11-15: 5 mg via INTRAVENOUS

## 2023-11-15 MED ORDER — HYDRALAZINE HCL 20 MG/ML IJ SOLN
INTRAMUSCULAR | Status: AC
  Filled 2023-11-15: qty 1

## 2023-11-15 MED ORDER — 0.9 % SODIUM CHLORIDE (POUR BTL) OPTIME
TOPICAL | Status: DC | PRN
  Administered 2023-11-15: 500 mL

## 2023-11-15 MED ORDER — ACETAMINOPHEN 10 MG/ML IV SOLN
INTRAVENOUS | Status: DC | PRN
  Administered 2023-11-15: 1000 mg via INTRAVENOUS

## 2023-11-15 MED ORDER — ONDANSETRON HCL 4 MG/2ML IJ SOLN
INTRAMUSCULAR | Status: DC | PRN
  Administered 2023-11-15: 4 mg via INTRAVENOUS

## 2023-11-15 MED ORDER — ONDANSETRON HCL 4 MG/2ML IJ SOLN
INTRAMUSCULAR | Status: AC
Start: 2023-11-15 — End: ?
  Filled 2023-11-15: qty 2

## 2023-11-15 MED ORDER — FENTANYL CITRATE PF 50 MCG/ML IJ SOSY: 25.0000 ug | PREFILLED_SYRINGE | INTRAMUSCULAR | Status: DC | PRN

## 2023-11-15 MED ORDER — SODIUM CHLORIDE 0.9% FLUSH: 3.0000 mL | Freq: Two times a day (BID) | INTRAVENOUS | Status: DC

## 2023-11-15 MED ORDER — OXYCODONE HCL 5 MG PO TABS
5.0000 mg | ORAL_TABLET | Freq: Once | ORAL | Status: AC | PRN
  Administered 2023-11-15: 5 mg via ORAL

## 2023-11-15 MED ORDER — CEFAZOLIN SODIUM-DEXTROSE 2-4 GM/100ML-% IV SOLN
2.0000 g | INTRAVENOUS | Status: AC
  Administered 2023-11-15: 2 g via INTRAVENOUS

## 2023-11-15 MED ORDER — ONDANSETRON HCL 4 MG/2ML IJ SOLN: 4.0000 mg | Freq: Four times a day (QID) | INTRAMUSCULAR | Status: DC | PRN

## 2023-11-15 MED ORDER — SODIUM CHLORIDE 0.9% FLUSH: 3.0000 mL | INTRAVENOUS | Status: DC | PRN

## 2023-11-15 MED ORDER — ACETAMINOPHEN 10 MG/ML IV SOLN: 1000.0000 mg | Freq: Once | INTRAVENOUS | Status: DC | PRN

## 2023-11-15 MED ORDER — SEVOFLURANE IN SOLN
RESPIRATORY_TRACT | Status: AC
  Filled 2023-11-15: qty 250

## 2023-11-15 MED ORDER — BUPIVACAINE HCL (PF) 0.5 % IJ SOLN
INTRAMUSCULAR | Status: AC
  Filled 2023-11-15: qty 10

## 2023-11-15 MED ORDER — ONDANSETRON HCL 4 MG PO TABS: 4.0000 mg | ORAL_TABLET | Freq: Four times a day (QID) | ORAL | Status: DC | PRN

## 2023-11-15 MED ORDER — FENTANYL CITRATE (PF) 100 MCG/2ML IJ SOLN
INTRAMUSCULAR | Status: DC | PRN
  Administered 2023-11-15: 25 ug via INTRAVENOUS
  Administered 2023-11-15: 50 ug via INTRAVENOUS
  Administered 2023-11-15: 25 ug via INTRAVENOUS

## 2023-11-15 MED ORDER — ACETAMINOPHEN 10 MG/ML IV SOLN
INTRAVENOUS | Status: AC
  Filled 2023-11-15: qty 100

## 2023-11-15 MED ORDER — HYDROCODONE-ACETAMINOPHEN 5-325 MG PO TABS
1.0000 | ORAL_TABLET | ORAL | Status: DC | PRN
Start: 2023-11-15 — End: 2023-11-15

## 2023-11-15 MED ORDER — OXYCODONE HCL 5 MG PO TABS
ORAL_TABLET | ORAL | Status: AC
  Filled 2023-11-15: qty 1

## 2023-11-15 MED ORDER — DEXAMETHASONE SODIUM PHOSPHATE 4 MG/ML IJ SOLN
INTRAMUSCULAR | Status: AC
  Filled 2023-11-15: qty 1

## 2023-11-15 MED ORDER — PROPOFOL 10 MG/ML IV BOLUS
INTRAVENOUS | Status: DC | PRN
  Administered 2023-11-15: 150 mg via INTRAVENOUS

## 2023-11-15 MED ORDER — BUPIVACAINE HCL (PF) 0.5 % IJ SOLN
INTRAMUSCULAR | Status: DC | PRN
  Administered 2023-11-15: 50 mg via PERINEURAL

## 2023-11-15 MED ORDER — MIDAZOLAM HCL 2 MG/2ML IJ SOLN
INTRAMUSCULAR | Status: AC
  Filled 2023-11-15: qty 2

## 2023-11-15 MED ORDER — MORPHINE SULFATE (PF) 2 MG/ML IV SOLN
0.5000 mg | INTRAVENOUS | Status: DC | PRN
Start: 2023-11-15 — End: 2023-11-15

## 2023-11-15 MED ORDER — HYDROCODONE-ACETAMINOPHEN 5-325 MG PO TABS
ORAL_TABLET | ORAL | Status: AC
  Filled 2023-11-15: qty 2

## 2023-11-15 MED ORDER — MIDAZOLAM HCL 5 MG/5ML IJ SOLN
INTRAMUSCULAR | Status: DC | PRN
  Administered 2023-11-15 (×2): 1 mg via INTRAVENOUS

## 2023-11-15 MED ORDER — SODIUM CHLORIDE 0.9 % IV SOLN: INTRAVENOUS | Status: DC | PRN

## 2023-11-15 MED ORDER — KETOROLAC TROMETHAMINE 30 MG/ML IJ SOLN
INTRAMUSCULAR | Status: AC
Start: 2023-11-15 — End: ?
  Filled 2023-11-15: qty 1

## 2023-11-15 MED ORDER — FENTANYL CITRATE (PF) 100 MCG/2ML IJ SOLN
INTRAMUSCULAR | Status: AC
  Filled 2023-11-15: qty 2

## 2023-11-15 MED ORDER — OXYCODONE HCL 5 MG/5ML PO SOLN: 5.0000 mg | Freq: Once | ORAL | Status: AC | PRN

## 2023-11-15 MED ORDER — KETOROLAC TROMETHAMINE 30 MG/ML IJ SOLN
INTRAMUSCULAR | Status: DC | PRN
  Administered 2023-11-15: 30 mg via INTRAVENOUS

## 2023-11-15 MED ORDER — LIDOCAINE HCL (CARDIAC) PF 100 MG/5ML IV SOSY
PREFILLED_SYRINGE | INTRAVENOUS | Status: DC | PRN
  Administered 2023-11-15: 100 mg via INTRATRACHEAL

## 2023-11-15 SURGICAL SUPPLY — 31 items
BIT DRILL 2 FAST STEP (BIT) IMPLANT
BIT DRILL 2.5X4 QC (BIT) IMPLANT
BNDG ELASTIC 3X5.8 VLCR NS LF (GAUZE/BANDAGES/DRESSINGS) ×1 IMPLANT
CHLORAPREP W/TINT 26 (MISCELLANEOUS) ×1 IMPLANT
COVER LIGHT HANDLE UNIVERSAL (MISCELLANEOUS) ×2 IMPLANT
CUFF TOURN SGL QUICK 18X4 (TOURNIQUET CUFF) IMPLANT
DRAPE FLUOR MINI C-ARM 54X84 (DRAPES) ×1 IMPLANT
ELECT REM PT RETURN 9FT ADLT (ELECTROSURGICAL) ×1
ELECTRODE REM PT RTRN 9FT ADLT (ELECTROSURGICAL) ×1 IMPLANT
GAUZE SPONGE 4X4 12PLY STRL (GAUZE/BANDAGES/DRESSINGS) ×1 IMPLANT
GAUZE XEROFORM 1X8 LF (GAUZE/BANDAGES/DRESSINGS) ×1 IMPLANT
GLOVE SURG SYN 9.0 PF PI (GLOVE) ×1 IMPLANT
GOWN STRL REIN 2XL XLG LVL4 (GOWN DISPOSABLE) ×1 IMPLANT
GOWN STRL REUS W/ TWL LRG LVL3 (GOWN DISPOSABLE) ×1 IMPLANT
K-WIRE FX5X1.6XNS BN SS (WIRE) ×1
KIT TURNOVER KIT A (KITS) ×1 IMPLANT
KWIRE FX5X1.6XNS BN SS (WIRE) IMPLANT
NS IRRIG 500ML POUR BTL (IV SOLUTION) ×1 IMPLANT
PACK EXTREMITY ARMC (MISCELLANEOUS) ×1 IMPLANT
PADDING CAST BLEND 3X4 STRL (MISCELLANEOUS) ×2 IMPLANT
PEG SUBCHONDRAL SMOOTH 2.0X10 (Screw) IMPLANT
PEG SUBCHONDRAL SMOOTH 2.0X14 (Peg) IMPLANT
PEG SUBCHONDRAL SMOOTH 2.0X16 (Peg) IMPLANT
PEG SUBCHONDRAL SMOOTH 2.0X20 (Peg) IMPLANT
PLATE SHORT 21.6X48.9 NRRW RT (Plate) IMPLANT
SPLINT CAST 1 STEP 3X12 (MISCELLANEOUS) ×1 IMPLANT
SUT ETHILON 4 0 FS (SUTURE) IMPLANT
SUT ETHILON 4-0 FS2 18XMFL BLK (SUTURE) ×1
SUT VIC AB 3-0 SH 27X BRD (SUTURE) IMPLANT
SUT VICRYL 3-0 27IN (SUTURE) ×1 IMPLANT
SUTURE ETHLN 4-0 FS2 18XMF BLK (SUTURE) ×1 IMPLANT

## 2023-11-15 NOTE — Anesthesia Preprocedure Evaluation (Addendum)
Anesthesia Evaluation  Patient identified by MRN, date of birth, ID band Patient awake    Reviewed: Allergy & Precautions, H&P , NPO status , Patient's Chart, lab work & pertinent test results  Airway Mallampati: II  TM Distance: >3 FB Neck ROM: full    Dental  (+) Missing, Partial Upper   Pulmonary former smoker   Pulmonary exam normal        Cardiovascular negative cardio ROS Normal cardiovascular exam     Neuro/Psych negative neurological ROS  negative psych ROS   GI/Hepatic negative GI ROS, Neg liver ROS,,,  Endo/Other  negative endocrine ROS    Renal/GU negative Renal ROS  negative genitourinary   Musculoskeletal   Abdominal Normal abdominal exam  (+)   Peds  Hematology negative hematology ROS (+)   Anesthesia Other Findings Past Medical History: No date: Bell's palsy 2008: DVT (deep venous thrombosis) (HCC)     Comment:  right leg (after a fall and a fracture) No date: History of kidney stones     Comment:  2 times No date: Pneumonia     Comment:  child No date: Rheumatic fever in pediatric patient     Comment:  68 years old No date: Wears dentures     Comment:  partial upper  Past Surgical History: 1980: APPENDECTOMY 07/22/2020: BREAST BIOPSY; Right     Comment:  distortion, path pending 10/15/2020: BREAST EXCISIONAL BIOPSY; Right 10/15/2020: BREAST LUMPECTOMY WITH RADIOACTIVE SEED LOCALIZATION;  Right     Comment:  Procedure: RIGHT BREAST LUMPECTOMY WITH RADIOACTIVE SEED              LOCALIZATION;  Surgeon: Griselda Miner, MD;  Location: [facility]              OR;  Service: General;  Laterality: Right; 1990: CESAREAN SECTION No date: COLONOSCOPY 2004: KIDNEY STONE SURGERY  BMI    Body Mass Index: 24.63 kg/m      Reproductive/Obstetrics negative OB ROS                             Anesthesia Physical Anesthesia Plan  ASA: 2  Anesthesia Plan: General   Post-op Pain  Management: Regional block   Induction: Intravenous  PONV Risk Score and Plan: Propofol infusion and TIVA  Airway Management Planned: Natural Airway  Additional Equipment:   Intra-op Plan:   Post-operative Plan:   Informed Consent: I have reviewed the patients History and Physical, chart, labs and discussed the procedure including the risks, benefits and alternatives for the proposed anesthesia with the patient or authorized representative who has indicated his/her understanding and acceptance.     Dental Advisory Given  Plan Discussed with: CRNA and Surgeon  Anesthesia Plan Comments:         Anesthesia Quick Evaluation

## 2023-11-15 NOTE — Anesthesia Postprocedure Evaluation (Signed)
Anesthesia Post Note  Patient: Lori Dyer  Procedure(s) Performed: OPEN REDUCTION INTERNAL FIXATION (ORIF) DISTAL RADIUS FRACTURE (Right)  Patient location during evaluation: PACU Anesthesia Type: General Level of consciousness: awake and alert Pain management: pain level controlled Vital Signs Assessment: post-procedure vital signs reviewed and stable Respiratory status: spontaneous breathing, nonlabored ventilation and respiratory function stable Cardiovascular status: blood pressure returned to baseline and stable Postop Assessment: no apparent nausea or vomiting Anesthetic complications: no   No notable events documented.   Last Vitals:  Vitals:   11/15/23 1410 11/15/23 1415  BP: (!) 145/61 (!) 130/59  Pulse: 69 71  Resp: 11 15  Temp:    SpO2: 97% 96%    Last Pain:  Vitals:   11/15/23 1400  TempSrc:   PainSc: 7                  Foye Deer

## 2023-11-15 NOTE — Op Note (Signed)
11/15/2023  1:26 PM  PATIENT:  Lori Dyer  68 y.o. female  PRE-OPERATIVE DIAGNOSIS:  Closed Colles' fracture of right radius, initial encounter S52.531A  POST-OPERATIVE DIAGNOSIS:  Closed Colles' fracture of right radius, initial encounter  PROCEDURE:  Procedure(s): OPEN REDUCTION INTERNAL FIXATION (ORIF) DISTAL RADIUS FRACTURE (Right)  SURGEON: Leitha Schuller, MD  ASSISTANTS: none  ANESTHESIA:   general  EBL:  Total I/O In: 100 [I.V.:100] Out: 3 [Blood:3]  BLOOD ADMINISTERED:none  DRAINS: none   LOCAL MEDICATIONS USED:  MARCAINE    and Amount: 10 ml  SPECIMEN:  No Specimen  DISPOSITION OF SPECIMEN:  N/A  COUNTS:  YES  TOURNIQUET:   Total Tourniquet Time Documented: Upper Arm (Right) - 22 minutes Total: Upper Arm (Right) - 22 minutes   IMPLANTS: Short narrow DVR plate right with multiple smooth pegs and screws  DICTATION: .Dragon Dictation patient was brought to the operating room and after the arm was prepped and draped in the usual sterile fashion after general anesthesia was placed.   Tourniquet having been applied to the upper arm.  Appropriate patient identification and timeout procedures were completed.  Tourniquet was raised and a volar approach was made.Marland Kitchen  Approach is center of the FCR tendon.  Tendon sheath incised and the tendon retracted radially protect the radial artery and associated veins.  The deep fascia was incised and the pronator was elevated off the distal and proximal fragments with exposure of the fracture site.  The fracture was extra articular  and with traction applied and Freer elevator adequate alignment was obtained.  A short narrow DVR plate applied with distal first technique using a K wire to hold the plate in position position of the plate was checked in both AP and lateral projections.  When acceptable position was obtained the smooth pegs were placed drilling measuring and placing the smooth pegs in the distal fragments  following  this the shaft portion of the plate was brought down to the bone and 3  cortical screws were sequentially inserted they gave an essentially anatomic alignment in both AP and lateral projections.  Under fluoroscopic views there is no motion of the fracture.   The wound irrigated with tourniquet let down.  Wound was then closed with 3-0 Vicryl for the deep tissue and 4-0 nylon in a simple interrupted manner..  Dressing of 4 x 4's web roll and volar splint followed by Ace wrap applied.   PLAN OF CARE: Discharge to home after PACU  PATIENT DISPOSITION:  PACU - hemodynamically stable.

## 2023-11-15 NOTE — Transfer of Care (Signed)
Immediate Anesthesia Transfer of Care Note  Patient: Lori Dyer  Procedure(s) Performed: OPEN REDUCTION INTERNAL FIXATION (ORIF) DISTAL RADIUS FRACTURE (Right)  Patient Location: PACU  Anesthesia Type: General  Level of Consciousness: awake, alert  and patient cooperative  Airway and Oxygen Therapy: Patient Spontanous Breathing and Patient connected to supplemental oxygen  Post-op Assessment: Post-op Vital signs reviewed, Patient's Cardiovascular Status Stable, Respiratory Function Stable, Patent Airway and No signs of Nausea or vomiting  Post-op Vital Signs: Reviewed and stable  Complications: No notable events documented.

## 2023-11-15 NOTE — Anesthesia Procedure Notes (Signed)
Anesthesia Regional Block: Supraclavicular block   Pre-Anesthetic Checklist: , timeout performed,  Correct Patient, Correct Site, Correct Laterality,  Correct Procedure, Correct Position, site marked,  Risks and benefits discussed,  Surgical consent,  Pre-op evaluation,  At surgeon's request and post-op pain management  Laterality: Upper and Right  Prep: chloraprep       Needles:  Injection technique: Single-shot  Needle Type: Stimiplex     Needle Length: 9cm  Needle Gauge: 22     Additional Needles:   Procedures:,,,, ultrasound used (permanent image in chart),,    Narrative:  Start time: 11/15/2023 2:02 PM End time: 11/15/2023 2:06 PM Injection made incrementally with aspirations every 5 mL.  Performed by: Personally  Anesthesiologist: Foye Deer, MD  Additional Notes: Patient consented for risk and benefits of nerve block including but not limited to nerve damage, failed block, bleeding and infection.  Patient voiced understanding.  Functioning IV was confirmed and monitors were applied.  Timeout done prior to procedure and prior to any sedation being given to the patient.  Patient confirmed procedure site prior to any sedation given to the patient. Sterile prep,hand hygiene and sterile gloves were used.  Minimal sedation used for procedure.  No paresthesia endorsed by patient during the procedure.  Negative aspiration and negative test dose prior to incremental administration of local anesthetic. The patient tolerated the procedure well with no immediate complications.

## 2023-11-15 NOTE — Anesthesia Procedure Notes (Signed)
Procedure Name: LMA Insertion Date/Time: 11/15/2023 12:41 PM  Performed by: Barbette Hair, CRNAPre-anesthesia Checklist: Patient identified, Emergency Drugs available, Suction available and Patient being monitored Patient Re-evaluated:Patient Re-evaluated prior to induction Oxygen Delivery Method: Circle system utilized Preoxygenation: Pre-oxygenation with 100% oxygen Induction Type: IV induction Number of attempts: 1 Tube secured with: Tape Dental Injury: Teeth and Oropharynx as per pre-operative assessment

## 2023-11-15 NOTE — H&P (Signed)
Review of Systems Constitutional: Positive for activity change. Musculoskeletal: Positive for arthralgias. Neurological: Positive for weakness. All other systems reviewed and are negative.  Answers submitted by the patient for this visit: Right Hand/Upper Extremity HPI Questionnaire (Submitted on 11/09/2023) Chief Complaint: HPI - Right Hand/UE How did your symptoms begin?: suddenly with injury How long ago did your symptoms begin?: 1 Days Please indicate all symptoms related to your issue: pain, swelling Where is your pain located? (select all that apply): right wrist Please select what best describes your pain (choose all that apply): aching, radiating Please choose the severity that best describes your pain: moderate Rate the pain on a scale of 0 (no pain) to 10 (worst pain ever): 8/10 How often does the pain occur?: constant Since the onset of your problem, has the pain improved, worsened, or stayed the same?: about the same as when symptoms started Do your symptoms wake you from sleep?: Yes Please choose all the items that worsen your symptoms: activity with limb Please choose all the items that improve your symptoms: acetaminophen, splint Please indicate any studies you've had for this problem (select all that apply): X-rays Injury-related questions (Submitted on 11/09/2023) When was the date of your injury?: 11/08/2023 Is this a work-related injury?: No Have you filed a worker's comp claim?: No Questionnaire about: HPI - Right Hand/UE (Submitted on 11/09/2023) Chief Complaint: HPI - Right Hand/UE  Electronically signed by Harrel Carina, CMA at 11/10/2023 2:39 PM EST  Back to top of Progress Notes Marlena Clipper, MD - 11/10/2023 10:30 AM EST Formatting of this note is different from the original. Answers submitted by the patient for this visit: Right Hand/Upper Extremity HPI Questionnaire (Submitted on 11/09/2023) Chief Complaint: HPI - Right Hand/UE How did your  symptoms begin?: suddenly with injury How long ago did your symptoms begin?: 1 Days Please indicate all symptoms related to your issue: pain, swelling Where is your pain located? (select all that apply): right wrist Please select what best describes your pain (choose all that apply): aching, radiating Please choose the severity that best describes your pain: moderate Rate the pain on a scale of 0 (no pain) to 10 (worst pain ever): 8/10 How often does the pain occur?: constant Since the onset of your problem, has the pain improved, worsened, or stayed the same?: about the same as when symptoms started Do your symptoms wake you from sleep?: Yes Please choose all the items that worsen your symptoms: activity with limb Please choose all the items that improve your symptoms: acetaminophen, splint Please indicate any studies you've had for this problem (select all that apply): X-rays Injury-related questions (Submitted on 11/09/2023) When was the date of your injury?: 11/08/2023 Is this a work-related injury?: No Have you filed a worker's comp claim?: No Questionnaire about: HPI - Right Hand/UE (Submitted on 11/09/2023) Chief Complaint: HPI - Right Hand/UE  Chief Complaint Patient presents with Right Wrist - Pain, Edema   History of the Present Illness: Lori Dyer is a 68 y.o. female here today.  The patient is a 68 year old female who had a right distal radius fracture 2 days ago. She went to our walk-in clinic where x-rays showed displaced distal extra-articular fracture with about 30 degrees dorsal angulation of the joint. She is accompanied by an adult female.  She reports swelling in her fingers, but retains sensation in her fingers.  As a right-handed individual, she maintains an active lifestyle.  I have reviewed past medical, surgical, social and family history, and  allergies as documented in the EMR.  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 12/13/2016 BMD with minimal T score of -2.3 in left hip; FRAX 3.0% & 17.0% 06/17/2020 BMD with minimal T score of -2.6, FRAX 7.2% & 26% Pyelonephritis Rheumatic fever without heart involvement 1967 Right-sided Bell's palsy 2004  Past Surgical History: Past Surgical History: Procedure Laterality Date APPENDECTOMY 1980 CESAREAN SECTION 1990 CYSTOSCOPY W/ URETEROSCOPY W/ LITHOTRIPSY in 2004 and 2006  Past Family History: Family History Problem Relation Age of Onset Stroke Father Cancer Father Other Father carotid atherosclerosis Breast cancer Sister 73 Hyperlipidemia (Elevated cholesterol) Sister Diabetes type II Brother Hyperlipidemia (Elevated cholesterol) Brother Breast cancer Sister 65 No Known Problems Sister Diabetes type II Sister Other Sister S/P partial colectomy Diabetes type II Brother Hyperlipidemia (Elevated cholesterol) Brother No Known Problems Brother No Known Problems Brother No Known Problems Brother Other Daughter HELLP with pregnancy  Medications: Current Outpatient Medications Medication Sig Dispense Refill atorvastatin (LIPITOR) 40 MG tablet Take 1 tablet (40 mg total) by mouth once daily for cholesterol 90 tablet 3 cholecalciferol (VITAMIN D3) 1000 unit capsule Take 1,000 Units by mouth once daily ibuprofen (MOTRIN) 200 MG tablet Take 200 mg by mouth every 6 (six) hours as needed for Pain multivitamin tablet Take 1 tablet by mouth once daily sertraline (ZOLOFT) 50 MG tablet TAKE 1 TABLET BY MOUTH EVERY DAY 90 tablet 4  No current facility-administered medications for this visit.  Allergies: No Known  Allergies  Body mass index is 24.66 kg/m.  Review of Systems: A comprehensive 14 point ROS was performed, reviewed, and the pertinent orthopaedic findings are documented in the HPI.  Vitals: 11/10/23 1037 BP: (!) 160/84   General Physical Examination:  Lungs are clear. Heart rate and rhythm is normal. HEENT is normal. Removable teeth are present in the front upper, otherwise normal.  Musculoskeletal: Sensation intact to fingers on the right. Some swelling noted from tight cast.  Radiographs:  Imaging X-rays from the walk-in clinic showed a displaced right distal extra-articular fracture with about 30 degrees dorsal angulation of the joint.  Assessment: ICD-10-CM 1. Closed Colles' fracture of right radius, initial encounter S52.531A  Plan:  The patient has clinical findings of displaced distal extra-articular fracture with about 30 degrees dorsal angulation of the joint.  The recommendation is for open reduction and internal fixation (ORIF) to restore wrist function and grip strength. A brace was ordered, dispensed and applied to manage swelling, and she is advised to prop her hand up to reduce swelling. Surgery is to be scheduled for either Tuesday or Friday next week. Post-surgery, she should work her fingers and keep her hand elevated at night using a pillow.  Surgical Risks:  The nature of the condition and the proposed procedure has been reviewed in detail with the patient. Surgical versus non-surgical options and prognosis for recovery have been reviewed and the inherent risks and benefits of each have been discussed including the risks of infection, bleeding, injury to nerves/blood vessels/tendons, incomplete relief of symptoms, persisting pain and/or stiffness, loss of function, complex regional pain syndrome, failure of the procedure, as appropriate. She is very high functioning.  Teeth: Removable teeth are present in the front upper, otherwise normal.  Document  Attestation: I, Dawn  Royse, have reviewed and updated documentation for [facility], MD, utilizing [employer].   Electronically signed by Marlena Clipper, MD at 11/10/2023 2:39 PM EST  .Reviewed  H+P. No changes noted.

## 2023-11-16 ENCOUNTER — Encounter: Payer: Self-pay | Admitting: Orthopedic Surgery

## 2024-07-05 ENCOUNTER — Other Ambulatory Visit: Payer: Self-pay | Admitting: Infectious Diseases

## 2024-07-05 DIAGNOSIS — Z1231 Encounter for screening mammogram for malignant neoplasm of breast: Secondary | ICD-10-CM

## 2024-07-06 ENCOUNTER — Encounter: Payer: Self-pay | Admitting: Infectious Diseases

## 2024-07-06 DIAGNOSIS — I700 Atherosclerosis of aorta: Secondary | ICD-10-CM

## 2024-07-09 ENCOUNTER — Other Ambulatory Visit: Payer: Self-pay | Admitting: Infectious Diseases

## 2024-07-09 DIAGNOSIS — I700 Atherosclerosis of aorta: Secondary | ICD-10-CM

## 2024-07-11 ENCOUNTER — Ambulatory Visit
Admission: RE | Admit: 2024-07-11 | Discharge: 2024-07-11 | Disposition: A | Discharge status: Home / Self Care | Source: Ambulatory Visit | Attending: Infectious Diseases | Admitting: Infectious Diseases

## 2024-07-11 DIAGNOSIS — I700 Atherosclerosis of aorta: Secondary | ICD-10-CM | POA: Insufficient documentation

## 2024-07-19 ENCOUNTER — Ambulatory Visit
Admission: RE | Admit: 2024-07-19 | Discharge: 2024-07-19 | Disposition: A | Discharge status: Home / Self Care | Source: Ambulatory Visit | Attending: Infectious Diseases | Admitting: Infectious Diseases

## 2024-07-19 DIAGNOSIS — Z1231 Encounter for screening mammogram for malignant neoplasm of breast: Secondary | ICD-10-CM | POA: Diagnosis present
